# Patient Record
Sex: Male | Born: 1985 | Race: White | Hispanic: No | Marital: Married | Smoking: Current every day smoker
Health system: Southern US, Community
[De-identification: ages and names within clinical notes are randomized; demographics above are authoritative.]

## PROBLEM LIST (undated history)

## (undated) ENCOUNTER — Ambulatory Visit

---
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
## 2005-05-10 ENCOUNTER — Emergency Department (HOSPITAL_COMMUNITY): Admission: EM | Admit: 2005-05-10 | Discharge: 2005-05-11 | Payer: Self-pay | Admitting: Emergency Medicine

## 2015-04-03 ENCOUNTER — Encounter (HOSPITAL_COMMUNITY): Payer: Self-pay | Admitting: Emergency Medicine

## 2015-04-03 ENCOUNTER — Emergency Department (HOSPITAL_COMMUNITY)
Admission: EM | Admit: 2015-04-03 | Discharge: 2015-04-04 | Disposition: A | Discharge status: Home / Self Care | Payer: BLUE CROSS/BLUE SHIELD | Attending: Emergency Medicine | Admitting: Emergency Medicine

## 2015-04-03 ENCOUNTER — Emergency Department (HOSPITAL_COMMUNITY): Payer: BLUE CROSS/BLUE SHIELD

## 2015-04-03 DIAGNOSIS — Z720 Tobacco use: Secondary | ICD-10-CM | POA: Diagnosis not present

## 2015-04-03 DIAGNOSIS — Z7951 Long term (current) use of inhaled steroids: Secondary | ICD-10-CM | POA: Diagnosis not present

## 2015-04-03 DIAGNOSIS — Z880 Allergy status to penicillin: Secondary | ICD-10-CM | POA: Insufficient documentation

## 2015-04-03 DIAGNOSIS — N451 Epididymitis: Secondary | ICD-10-CM

## 2015-04-03 DIAGNOSIS — Z79899 Other long term (current) drug therapy: Secondary | ICD-10-CM | POA: Diagnosis not present

## 2015-04-03 DIAGNOSIS — N50811 Right testicular pain: Secondary | ICD-10-CM

## 2015-04-03 DIAGNOSIS — N508 Other specified disorders of male genital organs: Secondary | ICD-10-CM | POA: Diagnosis present

## 2015-04-03 NOTE — ED Notes (Signed)
Patient transported to Ultrasound 

## 2015-04-03 NOTE — ED Notes (Addendum)
Pt reported rt testicular pain and swelling with streaks of blood notice during sex and while taking a shower. Denies discoloration to testicles.

## 2015-04-03 NOTE — ED Notes (Signed)
Pt c/o R side testicular swelling since this morning. Pt sts last night he noticed blood in his semen last night with some mild testicular discomfort. Today, pt woke up with more swelling. Pain remained mild until he mowed his lawn on a riding Surveyor, mininglawn mower. When pt got off he was in severe pain and significantly increased swelling in the R testicle. Pt A&Ox4 and ambulatory.

## 2015-04-03 NOTE — ED Provider Notes (Signed)
CSN: [id number]     Arrival date & time 04/03/15  2111 History   First MD Initiated Contact with Patient 04/03/15 2137     Chief Complaint  Patient presents with  . Testicle Pain  . Groin Swelling     (Consider location/radiation/quality/duration/timing/severity/associated sxs/prior Treatment) HPI complains of right testicular pain, accompanied by right testicular swelling posterior aspect, onset yesterday, gradually, becoming worse this afternoon. Accompany symptoms is he's noted a slight pinkish to his ejaculate no fever. Pain is worse with walking improved with remaining still. Treated himself with ibuprofen with partial relief. No other associated symptoms History reviewed. No pertinent past medical history. History reviewed. No pertinent past surgical history. No family history on file. History  Substance Use Topics  . Smoking status: Current Every Day Smoker  . Smokeless tobacco: Not on file  . Alcohol Use: No   occasional alcohol use no illicit drug use  Review of Systems  Constitutional: Negative.   HENT: Negative.   Respiratory: Negative.   Cardiovascular: Negative.   Gastrointestinal: Negative.   Genitourinary: Positive for scrotal swelling and testicular pain.  Musculoskeletal: Negative.   Skin: Negative.   Neurological: Negative.   Psychiatric/Behavioral: Negative.   All other systems reviewed and are negative.     Allergies  Amoxicillin  Home Medications   Prior to Admission medications   Medication Sig Start Date End Date Taking? Authorizing Provider  esomeprazole (NEXIUM) 40 MG capsule Take 40 mg by mouth daily at 12 noon.   Yes Historical Provider, MD  fluticasone (FLONASE) 50 MCG/ACT nasal spray Place 2 sprays into both nostrils daily.   Yes Historical Provider, MD  ibuprofen (ADVIL,MOTRIN) 200 MG tablet Take 800 mg by mouth every 6 (six) hours as needed for moderate pain.   Yes Historical Provider, MD  Multiple Vitamin (MULTIVITAMIN WITH MINERALS) TABS  tablet Take 1 tablet by mouth daily.   Yes Historical Provider, MD   BP 160/100 mmHg  Pulse 99  Temp(Src) 97.8 F (36.6 C) (Oral)  Resp 18  Ht  (1.905 m)  Wt 202 lb (91.627 kg)  BMI 25.25 kg/m2  SpO2 100% Physical Exam  Constitutional: He appears well-developed and well-nourished.  HENT:  Head: Normocephalic and atraumatic.  Eyes: Conjunctivae are normal. Pupils are equal, round, and reactive to light.  Neck: Neck supple. No tracheal deviation present. No thyromegaly present.  Cardiovascular: Normal rate and regular rhythm.   No murmur heard. Pulmonary/Chest: Effort normal and breath sounds normal.  Abdominal: Soft. Bowel sounds are normal. He exhibits no distension. There is no tenderness.  Genitourinary: Penis normal.  Right testicle swollen, tender posteriorly both testes in normal lie  Musculoskeletal: Normal range of motion. He exhibits no edema or tenderness.  Neurological: He is alert. Coordination normal.  Skin: Skin is warm and dry. No rash noted.  Psychiatric: He has a normal mood and affect.  Nursing note and vitals reviewed.   ED Course  Procedures (including critical care time) Labs Review Labs Reviewed - No data to display  Imaging Review No results found.   EKG Interpretation None     Patient declines pain medicine. 12:50 AM patient resting comfortably. Results for orders placed or performed during the hospital encounter of 04/03/15  Urinalysis, Routine w reflex microscopic  Result Value Ref Range   Color, Urine YELLOW YELLOW   APPearance CLEAR CLEAR   Specific Gravity, Urine 1.006 1.005 - 1.030   pH 6.0 5.0 - 8.0   Glucose, UA NEGATIVE NEGATIVE mg/dL  Hgb urine dipstick NEGATIVE NEGATIVE   Bilirubin Urine NEGATIVE NEGATIVE   Ketones, ur NEGATIVE NEGATIVE mg/dL   Protein, ur NEGATIVE NEGATIVE mg/dL   Urobilinogen, UA 0.2 0.0 - 1.0 mg/dL   Nitrite NEGATIVE NEGATIVE   Leukocytes, UA NEGATIVE NEGATIVE   [place] Scrotum  04/04/2015   CLINICAL  DATA:  RIGHT testicular pain and swelling beginning this morning. Severe RIGHT testicle pain after riding a lawnmower today.  EXAM: SCROTAL ULTRASOUND  DOPPLER ULTRASOUND OF THE TESTICLES  TECHNIQUE: Complete ultrasound examination of the testicles, epididymis, and other scrotal structures was performed. Color and spectral Doppler ultrasound were also utilized to evaluate blood flow to the testicles.  COMPARISON:  None.  FINDINGS: Right testicle  Measurements: 4.5 x 2.1 x 3 cm. No mass or microlithiasis visualized.  Left testicle  Measurements: 4.6 x 2.1 x 2.3 cm. No mass or microlithiasis visualized.  Right epididymis:  Mildly enlarged and hyperemic.  Left epididymis:  Normal in size and appearance.  Hydrocele:  None visualized.  Varicocele:  None visualized.  Pulsed Doppler interrogation of both testes demonstrates normal low resistance arterial and venous waveforms bilaterally.  IMPRESSION: Acute RIGHT epididymitis without orchitis.   Electronically Signed   By: Awilda Metroourtnay  Bloomer   On: 04/04/2015 00:24   [place] Art/ven Flow Abd Pelv Doppler  04/04/2015   CLINICAL DATA:  RIGHT testicular pain and swelling beginning this morning. Severe RIGHT testicle pain after riding a lawnmower today.  EXAM: SCROTAL ULTRASOUND  DOPPLER ULTRASOUND OF THE TESTICLES  TECHNIQUE: Complete ultrasound examination of the testicles, epididymis, and other scrotal structures was performed. Color and spectral Doppler ultrasound were also utilized to evaluate blood flow to the testicles.  COMPARISON:  None.  FINDINGS: Right testicle  Measurements: 4.5 x 2.1 x 3 cm. No mass or microlithiasis visualized.  Left testicle  Measurements: 4.6 x 2.1 x 2.3 cm. No mass or microlithiasis visualized.  Right epididymis:  Mildly enlarged and hyperemic.  Left epididymis:  Normal in size and appearance.  Hydrocele:  None visualized.  Varicocele:  None visualized.  Pulsed Doppler interrogation of both testes demonstrates normal low resistance arterial and  venous waveforms bilaterally.  IMPRESSION: Acute RIGHT epididymitis without orchitis.   Electronically Signed   By: Awilda Metroourtnay  Bloomer   On: 04/04/2015 00:24    MDM  Plan Rocephin, doxycycline prior to discharge. Prescription doxycycline, Norco. Urology referral Final diagnoses:  None   diagnosis epididymitis      Doug SouSam Bobbyjoe Pabst, MD 04/04/15 [phone]

## 2015-04-03 NOTE — ED Notes (Signed)
Bed: WA11 Expected date:  Expected time:  Means of arrival:  Comments: Tr 1 

## 2015-04-04 DIAGNOSIS — N451 Epididymitis: Secondary | ICD-10-CM | POA: Diagnosis not present

## 2015-04-04 LAB — URINALYSIS, ROUTINE W REFLEX MICROSCOPIC
Bilirubin Urine: NEGATIVE
Glucose, UA: NEGATIVE mg/dL
Hgb urine dipstick: NEGATIVE
Ketones, ur: NEGATIVE mg/dL
Leukocytes, UA: NEGATIVE
Nitrite: NEGATIVE
PH: 6 (ref 5.0–8.0)
Protein, ur: NEGATIVE mg/dL
Specific Gravity, Urine: 1.006 (ref 1.005–1.030)
UROBILINOGEN UA: 0.2 mg/dL (ref 0.0–1.0)

## 2015-04-04 MED ORDER — HYDROCODONE-ACETAMINOPHEN 5-325 MG PO TABS: 1.0000 | ORAL_TABLET | Freq: Four times a day (QID) | ORAL | Status: AC | PRN

## 2015-04-04 MED ORDER — LIDOCAINE HCL (PF) 1 % IJ SOLN
0.9000 mL | Freq: Once | INTRAMUSCULAR | Status: AC
  Administered 2015-04-04: 0.9 mL
  Filled 2015-04-04: qty 5

## 2015-04-04 MED ORDER — DOXYCYCLINE HYCLATE 100 MG PO TABS
100.0000 mg | ORAL_TABLET | Freq: Once | ORAL | Status: AC
Start: 2015-04-04 — End: 2015-04-04
  Administered 2015-04-04: 100 mg via ORAL
  Filled 2015-04-04: qty 1

## 2015-04-04 MED ORDER — CEFTRIAXONE SODIUM 250 MG IJ SOLR
250.0000 mg | Freq: Once | INTRAMUSCULAR | Status: AC
  Administered 2015-04-04: 250 mg via INTRAMUSCULAR
  Filled 2015-04-04: qty 250

## 2015-04-04 MED ORDER — DOXYCYCLINE HYCLATE 100 MG PO CAPS
100.0000 mg | ORAL_CAPSULE | Freq: Two times a day (BID) | ORAL | Status: AC
Start: 2015-04-04 — End: ?

## 2015-04-04 NOTE — ED Notes (Signed)
Awake. Verbally responsive. A/O x4. Resp even and unlabored. No audible adventitious breath sounds noted. ABC's intact. Family at bedside. 

## 2015-04-04 NOTE — ED Notes (Signed)
Awake. Verbally responsive. A/O x4. Resp even and unlabored. No audible adventitious breath sounds noted. ABC's intact.  

## 2015-04-04 NOTE — Discharge Instructions (Signed)
Epididymitis Take Tylenol or Advil for mild pain or the pain medicine prescribed for bad pain. Call [facility] tomorrow to arrange to be seen within 7-10 days in the office. Epididymitis is a swelling (inflammation) of the epididymis. The epididymis is a cord-like structure along the back part of the testicle. Epididymitis is usually, but not always, caused by infection. This is usually a sudden problem beginning with chills, fever and pain behind the scrotum and in the testicle. There may be swelling and redness of the testicle. DIAGNOSIS  Physical examination will reveal a tender, swollen epididymis. Sometimes, cultures are obtained from the urine or from prostate secretions to help find out if there is an infection or if the cause is a different problem. Sometimes, blood work is performed to see if your white blood cell count is elevated and if a germ (bacterial) or viral infection is present. Using this knowledge, an appropriate medicine which kills germs (antibiotic) can be chosen by your caregiver. A viral infection causing epididymitis will most often go away (resolve) without treatment. HOME CARE INSTRUCTIONS   Hot sitz baths for 20 minutes, 4 times per day, may help relieve pain.  Only take over-the-counter or prescription medicines for pain, discomfort or fever as directed by your caregiver.  Take all medicines, including antibiotics, as directed. Take the antibiotics for the full prescribed length of time even if you are feeling better.  It is very important to keep all follow-up appointments. SEEK IMMEDIATE MEDICAL CARE IF:   You have a fever.  You have pain not relieved with medicines.  You have any worsening of your problems.  Your pain seems to come and go.  You develop pain, redness, and swelling in the scrotum and surrounding areas. MAKE SURE YOU:   Understand these instructions.  Will watch your condition.  Will get help right away if you are not doing well or  get worse. Document Released: 12/06/2000 Document Revised: 03/02/2012 Document Reviewed: 10/26/2009 [facility] Patient Information 2015 [employer], [place]. This information is not intended to replace advice given to you by your health care provider. Make sure you discuss any questions you have with your health care provider.

## 2015-04-04 NOTE — ED Notes (Signed)
Awake. Verbally responsive. A/O x4. Resp even and unlabored. No audible adventitious breath sounds noted. ABC's intact. Pt continues to report intermittent scrotum pain.

## 2015-04-04 NOTE — ED Notes (Signed)
Pt had no adverse reaction from ABT IM. Pt ambulated to BR with steady gait.

## 2016-10-27 IMAGING — US US SCROTUM
1 series · 14 of 25 positions shown · non-contrast
Comparison: None.

CLINICAL DATA: RIGHT testicular pain and swelling beginning this
morning. Severe RIGHT testicle pain after riding a lawnmower today.

EXAM:
SCROTAL ULTRASOUND
DOPPLER ULTRASOUND OF THE TESTICLES
TECHNIQUE: Complete ultrasound examination of the testicles, epididymis, and
other scrotal structures was performed. Color and spectral Doppler
ultrasound were also utilized to evaluate blood flow to the
testicles.

[Series 1: us scrotum · 0.06mm/px · 14 of 44 slices shown]
[im 1/44]
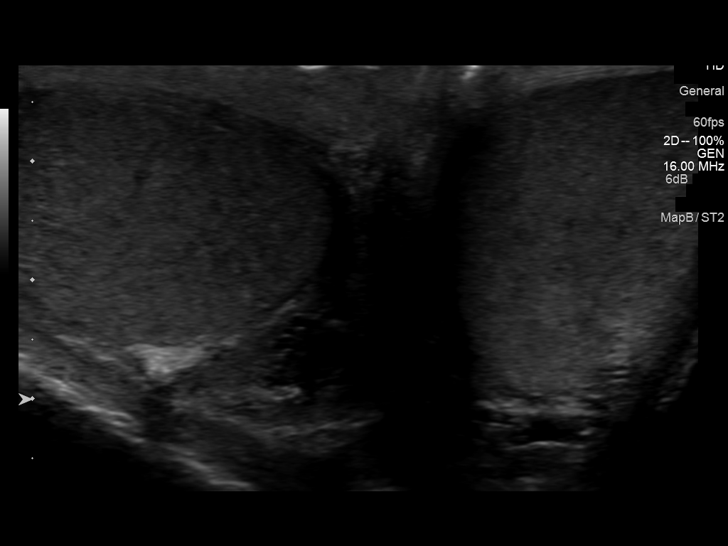
[im 4/44]
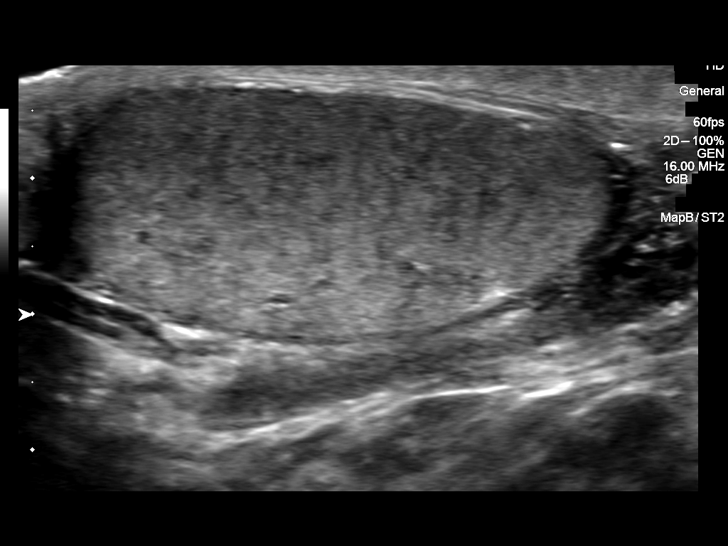
[im 8/44]
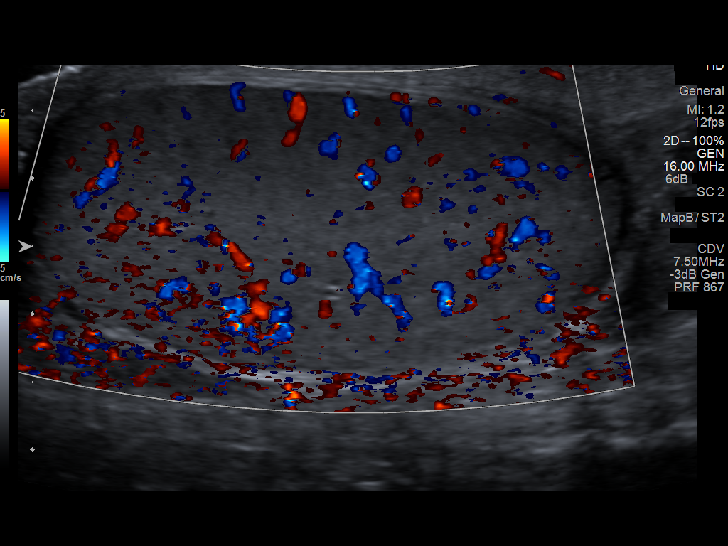
[im 11/44]
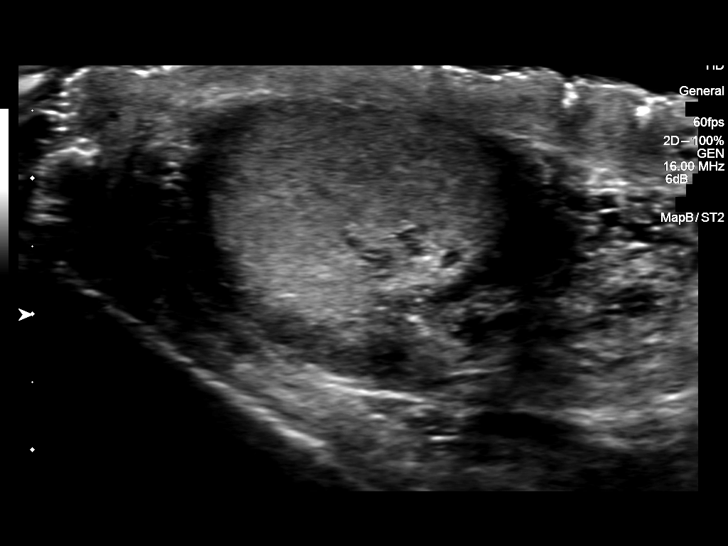
[im 15/44]
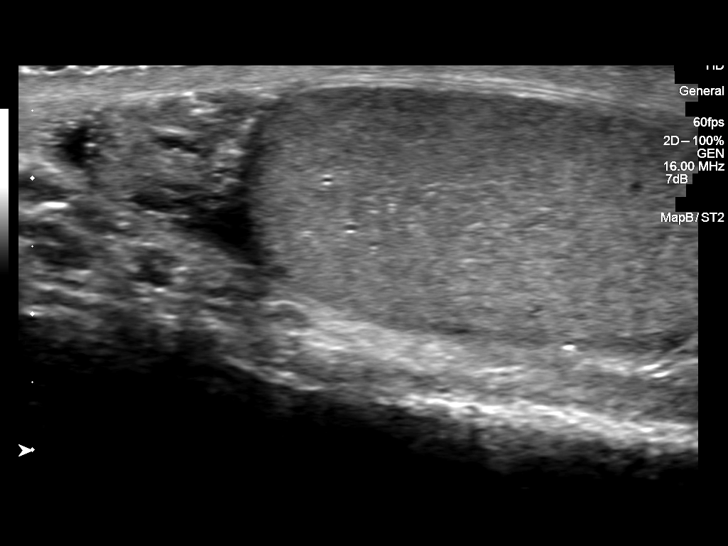
[im 17/44]
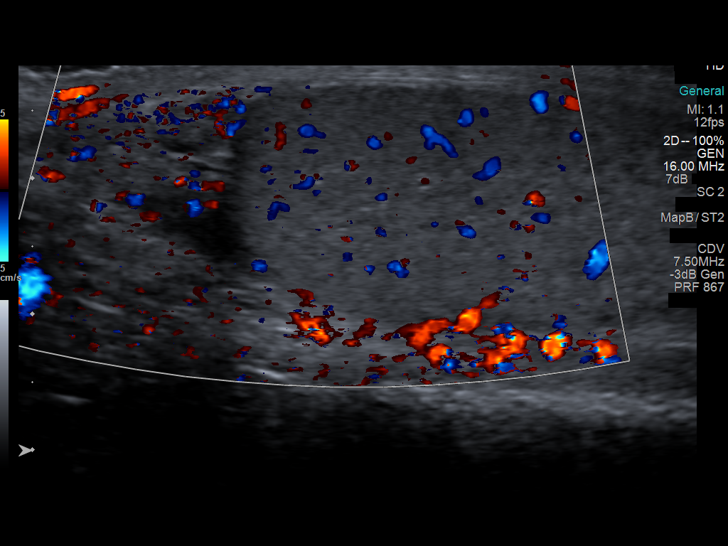
[im 20/44]
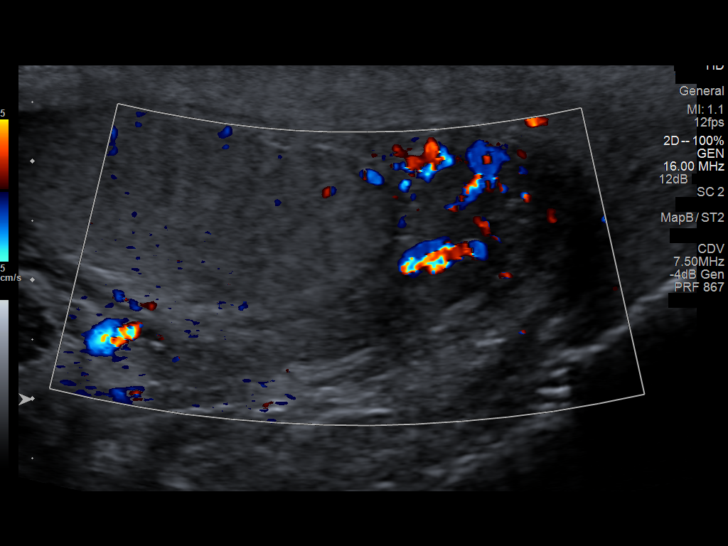
[im 24/44]
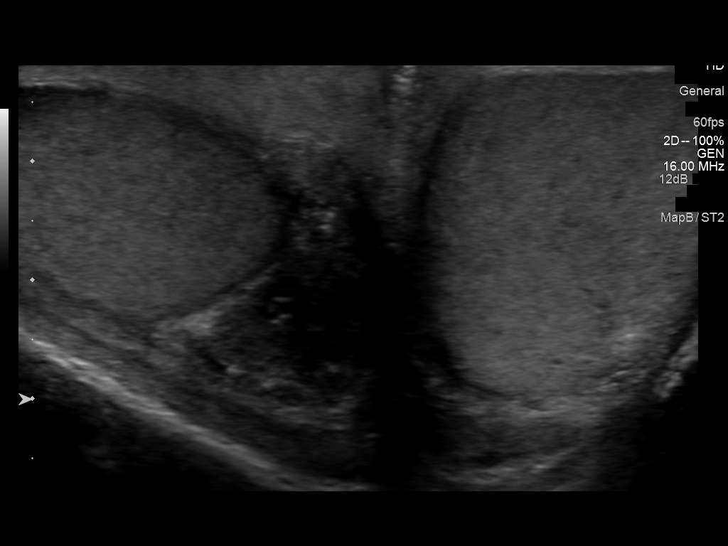
[im 27/44]
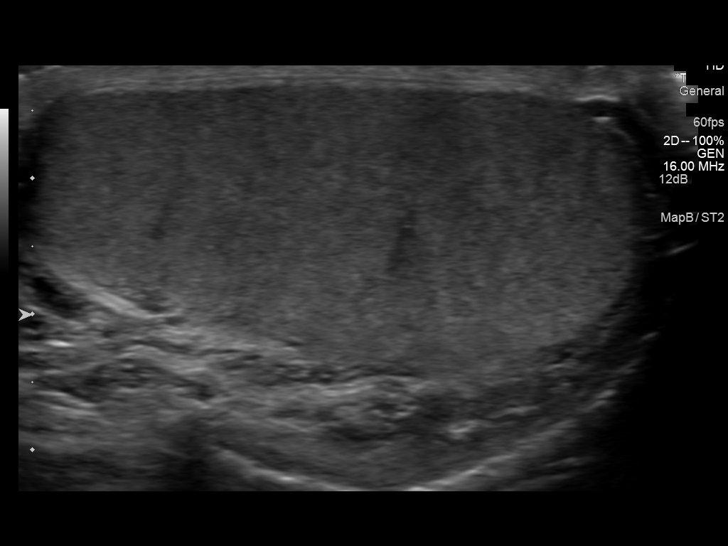
[im 29/44]
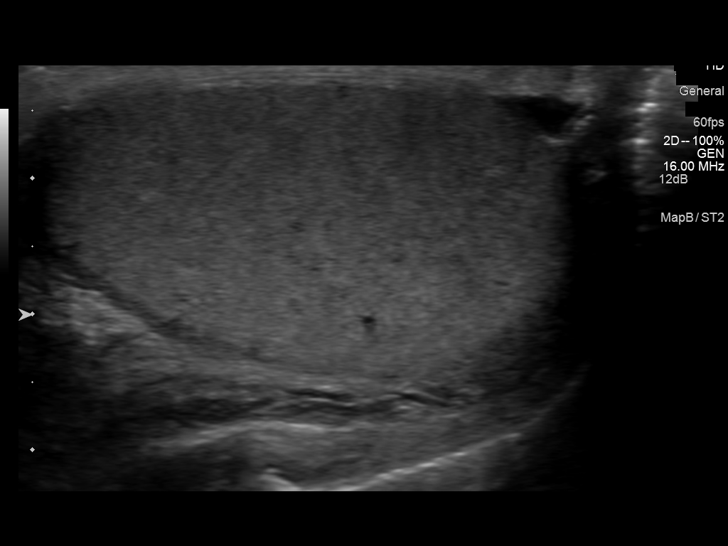
[im 33/44]
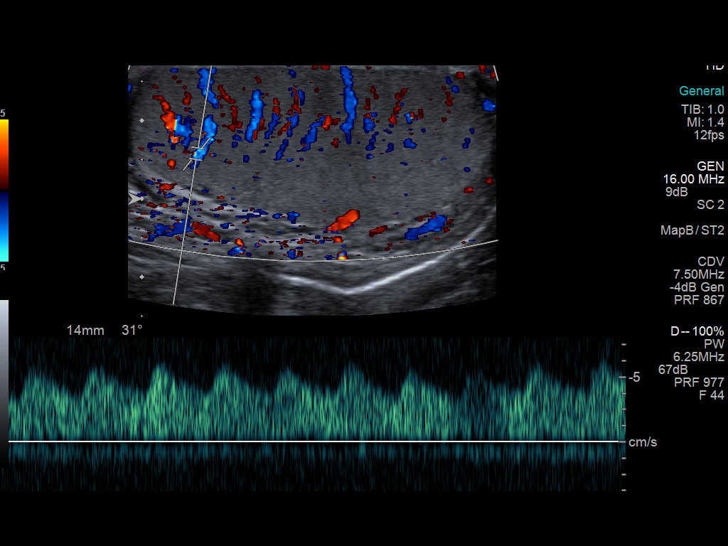
[im 36/44]
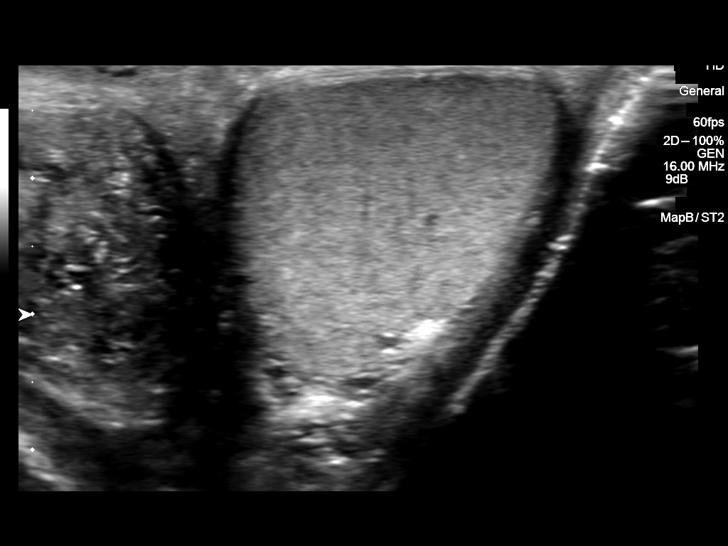
[im 40/44]
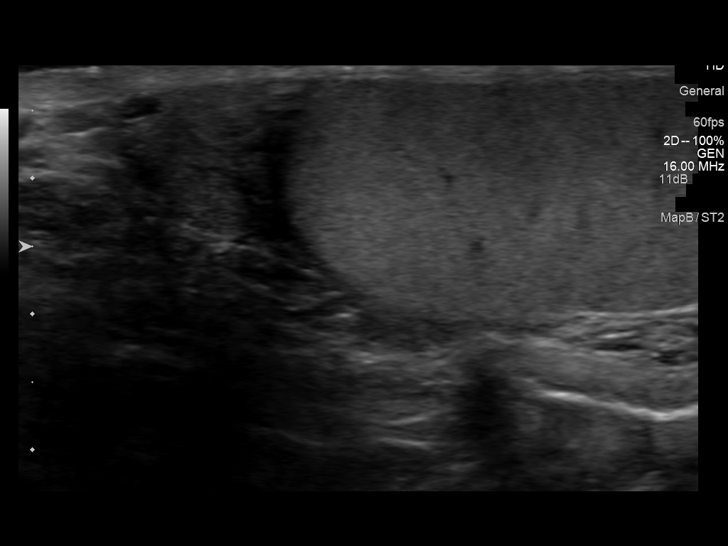
[im 44/44]
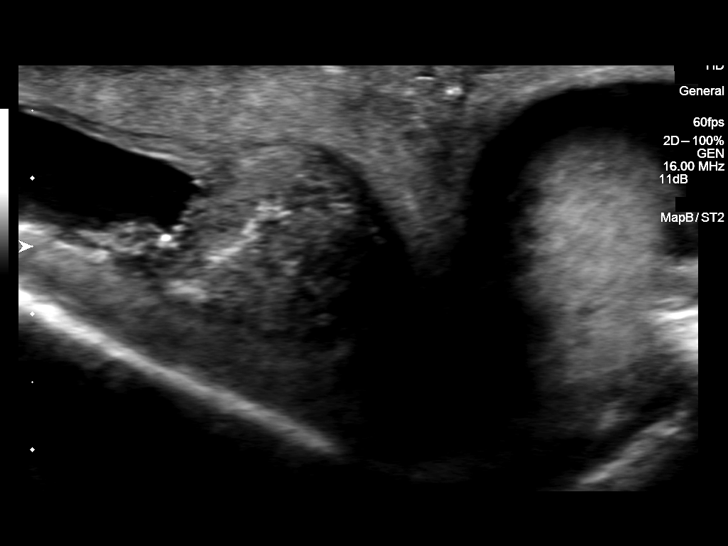

[14 of 25 positions shown; findings below may reference images not displayed]

FINDINGS: Right testicle

Measurements: 4.5 x 2.1 x 3 cm. No mass or microlithiasis
visualized.

Left testicle

Measurements: 4.6 x 2.1 x 2.3 cm. No mass or microlithiasis
visualized.

Right epididymis:  Mildly enlarged and hyperemic.

Left epididymis:  Normal in size and appearance.

Hydrocele:  None visualized.

Varicocele:  None visualized.

Pulsed Doppler interrogation of both testes demonstrates normal low
resistance arterial and venous waveforms bilaterally.
IMPRESSION: Acute RIGHT epididymitis without orchitis.

By: Karan Montilla
